# Patient Record
Sex: Female | Born: 1990 | Race: White | Hispanic: No | Marital: Single | Smoking: Current some day smoker
Health system: Southern US, Community
[De-identification: ages and names within clinical notes are randomized; demographics above are authoritative.]

---

## 2015-05-23 ENCOUNTER — Emergency Department (HOSPITAL_COMMUNITY)
Admission: EM | Admit: 2015-05-23 | Discharge: 2015-05-23 | Disposition: A | Discharge status: Home / Self Care | Payer: No Typology Code available for payment source | Attending: Emergency Medicine | Admitting: Emergency Medicine

## 2015-05-23 ENCOUNTER — Emergency Department (HOSPITAL_COMMUNITY): Payer: No Typology Code available for payment source

## 2015-05-23 ENCOUNTER — Encounter (HOSPITAL_COMMUNITY): Payer: Self-pay | Admitting: *Deleted

## 2015-05-23 DIAGNOSIS — S134XXA Sprain of ligaments of cervical spine, initial encounter: Secondary | ICD-10-CM | POA: Insufficient documentation

## 2015-05-23 DIAGNOSIS — Z720 Tobacco use: Secondary | ICD-10-CM | POA: Insufficient documentation

## 2015-05-23 DIAGNOSIS — Y9389 Activity, other specified: Secondary | ICD-10-CM | POA: Insufficient documentation

## 2015-05-23 DIAGNOSIS — Y998 Other external cause status: Secondary | ICD-10-CM | POA: Insufficient documentation

## 2015-05-23 DIAGNOSIS — S0990XA Unspecified injury of head, initial encounter: Secondary | ICD-10-CM | POA: Diagnosis not present

## 2015-05-23 DIAGNOSIS — S199XXA Unspecified injury of neck, initial encounter: Secondary | ICD-10-CM | POA: Diagnosis present

## 2015-05-23 DIAGNOSIS — Y92410 Unspecified street and highway as the place of occurrence of the external cause: Secondary | ICD-10-CM | POA: Insufficient documentation

## 2015-05-23 DIAGNOSIS — Z880 Allergy status to penicillin: Secondary | ICD-10-CM | POA: Insufficient documentation

## 2015-05-23 DIAGNOSIS — S139XXA Sprain of joints and ligaments of unspecified parts of neck, initial encounter: Secondary | ICD-10-CM

## 2015-05-23 DIAGNOSIS — S24109A Unspecified injury at unspecified level of thoracic spinal cord, initial encounter: Secondary | ICD-10-CM | POA: Diagnosis not present

## 2015-05-23 MED ORDER — IBUPROFEN 400 MG PO TABS
800.0000 mg | ORAL_TABLET | Freq: Once | ORAL | Status: AC
  Administered 2015-05-23: 800 mg via ORAL
  Filled 2015-05-23: qty 2

## 2015-05-23 MED ORDER — IBUPROFEN 800 MG PO TABS: 800.0000 mg | ORAL_TABLET | Freq: Three times a day (TID) | ORAL | Status: AC

## 2015-05-23 MED ORDER — IBUPROFEN 400 MG PO TABS: 800.0000 mg | ORAL_TABLET | Freq: Once | ORAL | Status: DC
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
## 2015-05-23 NOTE — Discharge Instructions (Signed)
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
